# Patient Record
Sex: Female | Born: 1935 | Hispanic: No | Smoking: Never smoker
Health system: Southern US, Community
[De-identification: ages and names within clinical notes are randomized; demographics above are authoritative.]

## PROBLEM LIST (undated history)

## (undated) DIAGNOSIS — H409 Unspecified glaucoma: Secondary | ICD-10-CM

## (undated) DIAGNOSIS — R569 Unspecified convulsions: Secondary | ICD-10-CM

---

## 2018-05-08 DIAGNOSIS — Z23 Encounter for immunization: Secondary | ICD-10-CM | POA: Diagnosis not present

## 2018-08-11 DIAGNOSIS — I872 Venous insufficiency (chronic) (peripheral): Secondary | ICD-10-CM | POA: Diagnosis not present

## 2018-08-11 DIAGNOSIS — G40909 Epilepsy, unspecified, not intractable, without status epilepticus: Secondary | ICD-10-CM | POA: Diagnosis not present

## 2018-08-11 DIAGNOSIS — R000 Tachycardia, unspecified: Secondary | ICD-10-CM | POA: Diagnosis not present

## 2018-08-11 DIAGNOSIS — Z1331 Encounter for screening for depression: Secondary | ICD-10-CM | POA: Diagnosis not present

## 2018-08-11 DIAGNOSIS — H409 Unspecified glaucoma: Secondary | ICD-10-CM | POA: Diagnosis not present

## 2018-08-14 DIAGNOSIS — D649 Anemia, unspecified: Secondary | ICD-10-CM | POA: Diagnosis not present

## 2019-03-31 DIAGNOSIS — Z20828 Contact with and (suspected) exposure to other viral communicable diseases: Secondary | ICD-10-CM | POA: Diagnosis not present

## 2019-04-01 DIAGNOSIS — Z1159 Encounter for screening for other viral diseases: Secondary | ICD-10-CM | POA: Diagnosis not present

## 2019-04-17 DIAGNOSIS — Z20828 Contact with and (suspected) exposure to other viral communicable diseases: Secondary | ICD-10-CM | POA: Diagnosis not present

## 2019-05-10 DIAGNOSIS — Z23 Encounter for immunization: Secondary | ICD-10-CM | POA: Diagnosis not present

## 2019-08-10 DIAGNOSIS — Z23 Encounter for immunization: Secondary | ICD-10-CM | POA: Diagnosis not present

## 2019-09-07 DIAGNOSIS — Z23 Encounter for immunization: Secondary | ICD-10-CM | POA: Diagnosis not present

## 2019-11-22 DIAGNOSIS — R609 Edema, unspecified: Secondary | ICD-10-CM | POA: Diagnosis not present

## 2019-11-22 DIAGNOSIS — R600 Localized edema: Secondary | ICD-10-CM | POA: Diagnosis not present

## 2020-03-14 DIAGNOSIS — H35373 Puckering of macula, bilateral: Secondary | ICD-10-CM | POA: Diagnosis not present

## 2020-03-14 DIAGNOSIS — H353131 Nonexudative age-related macular degeneration, bilateral, early dry stage: Secondary | ICD-10-CM | POA: Diagnosis not present

## 2020-03-14 DIAGNOSIS — H35033 Hypertensive retinopathy, bilateral: Secondary | ICD-10-CM | POA: Diagnosis not present

## 2020-03-14 DIAGNOSIS — H401134 Primary open-angle glaucoma, bilateral, indeterminate stage: Secondary | ICD-10-CM | POA: Diagnosis not present

## 2020-05-29 DIAGNOSIS — Z23 Encounter for immunization: Secondary | ICD-10-CM | POA: Diagnosis not present

## 2020-06-19 DIAGNOSIS — Z23 Encounter for immunization: Secondary | ICD-10-CM | POA: Diagnosis not present

## 2020-11-05 DIAGNOSIS — Z79899 Other long term (current) drug therapy: Secondary | ICD-10-CM | POA: Diagnosis not present

## 2020-11-05 DIAGNOSIS — D649 Anemia, unspecified: Secondary | ICD-10-CM | POA: Diagnosis not present

## 2020-11-05 DIAGNOSIS — E039 Hypothyroidism, unspecified: Secondary | ICD-10-CM | POA: Diagnosis not present

## 2021-03-18 ENCOUNTER — Emergency Department (HOSPITAL_COMMUNITY)
Admission: EM | Admit: 2021-03-18 | Discharge: 2021-03-18 | Disposition: A | Discharge status: Home / Self Care | Payer: BC Managed Care – PPO | Attending: Emergency Medicine | Admitting: Emergency Medicine

## 2021-03-18 ENCOUNTER — Other Ambulatory Visit: Payer: Self-pay

## 2021-03-18 ENCOUNTER — Encounter (HOSPITAL_COMMUNITY): Payer: Self-pay

## 2021-03-18 ENCOUNTER — Emergency Department (HOSPITAL_COMMUNITY): Payer: BC Managed Care – PPO

## 2021-03-18 DIAGNOSIS — R0902 Hypoxemia: Secondary | ICD-10-CM | POA: Diagnosis not present

## 2021-03-18 DIAGNOSIS — R9431 Abnormal electrocardiogram [ECG] [EKG]: Secondary | ICD-10-CM | POA: Diagnosis not present

## 2021-03-18 DIAGNOSIS — R404 Transient alteration of awareness: Secondary | ICD-10-CM | POA: Diagnosis not present

## 2021-03-18 DIAGNOSIS — R569 Unspecified convulsions: Secondary | ICD-10-CM | POA: Insufficient documentation

## 2021-03-18 DIAGNOSIS — Z7982 Long term (current) use of aspirin: Secondary | ICD-10-CM | POA: Diagnosis not present

## 2021-03-18 DIAGNOSIS — R410 Disorientation, unspecified: Secondary | ICD-10-CM | POA: Diagnosis not present

## 2021-03-18 DIAGNOSIS — G40909 Epilepsy, unspecified, not intractable, without status epilepticus: Secondary | ICD-10-CM | POA: Diagnosis not present

## 2021-03-18 DIAGNOSIS — R519 Headache, unspecified: Secondary | ICD-10-CM | POA: Diagnosis not present

## 2021-03-18 DIAGNOSIS — Z79899 Other long term (current) drug therapy: Secondary | ICD-10-CM | POA: Insufficient documentation

## 2021-03-18 DIAGNOSIS — R4182 Altered mental status, unspecified: Secondary | ICD-10-CM | POA: Diagnosis not present

## 2021-03-18 DIAGNOSIS — Z743 Need for continuous supervision: Secondary | ICD-10-CM | POA: Diagnosis not present

## 2021-03-18 HISTORY — DX: Unspecified convulsions: R56.9

## 2021-03-18 HISTORY — DX: Unspecified glaucoma: H40.9

## 2021-03-18 LAB — CBC WITH DIFFERENTIAL/PLATELET
Abs Immature Granulocytes: 0.05 10*3/uL (ref 0.00–0.07)
Basophils Absolute: 0 10*3/uL (ref 0.0–0.1)
Basophils Relative: 0 %
Eosinophils Absolute: 0.1 10*3/uL (ref 0.0–0.5)
Eosinophils Relative: 1 %
HCT: 37.6 % (ref 36.0–46.0)
Hemoglobin: 12.5 g/dL (ref 12.0–15.0)
Immature Granulocytes: 1 %
Lymphocytes Relative: 10 %
Lymphs Abs: 1 10*3/uL (ref 0.7–4.0)
MCH: 30.1 pg (ref 26.0–34.0)
MCHC: 33.2 g/dL (ref 30.0–36.0)
MCV: 90.6 fL (ref 80.0–100.0)
Monocytes Absolute: 0.7 10*3/uL (ref 0.1–1.0)
Monocytes Relative: 7 %
Neutro Abs: 8 10*3/uL — ABNORMAL HIGH (ref 1.7–7.7)
Neutrophils Relative %: 81 %
Platelets: 248 10*3/uL (ref 150–400)
RBC: 4.15 MIL/uL (ref 3.87–5.11)
RDW: 13.8 % (ref 11.5–15.5)
WBC: 9.9 10*3/uL (ref 4.0–10.5)
nRBC: 0 % (ref 0.0–0.2)

## 2021-03-18 LAB — URINALYSIS, ROUTINE W REFLEX MICROSCOPIC
Bacteria, UA: NONE SEEN
Bilirubin Urine: NEGATIVE
Glucose, UA: NEGATIVE mg/dL
Hgb urine dipstick: NEGATIVE
Ketones, ur: NEGATIVE mg/dL
Nitrite: NEGATIVE
Protein, ur: NEGATIVE mg/dL
Specific Gravity, Urine: 1.004 — ABNORMAL LOW (ref 1.005–1.030)
pH: 7 (ref 5.0–8.0)

## 2021-03-18 LAB — COMPREHENSIVE METABOLIC PANEL
ALT: 14 U/L (ref 0–44)
AST: 30 U/L (ref 15–41)
Albumin: 4.2 g/dL (ref 3.5–5.0)
Alkaline Phosphatase: 79 U/L (ref 38–126)
Anion gap: 12 (ref 5–15)
BUN: 16 mg/dL (ref 8–23)
CO2: 23 mmol/L (ref 22–32)
Calcium: 9.3 mg/dL (ref 8.9–10.3)
Chloride: 99 mmol/L (ref 98–111)
Creatinine, Ser: 0.98 mg/dL (ref 0.44–1.00)
GFR, Estimated: 57 mL/min — ABNORMAL LOW (ref >60)
Glucose, Bld: 104 mg/dL — ABNORMAL HIGH (ref 70–99)
Potassium: 4 mmol/L (ref 3.5–5.1)
Sodium: 134 mmol/L — ABNORMAL LOW (ref 135–145)
Total Bilirubin: 1 mg/dL (ref 0.3–1.2)
Total Protein: 7.6 g/dL (ref 6.5–8.1)

## 2021-03-18 LAB — TSH: TSH: 1.726 u[IU]/mL (ref 0.350–4.500)

## 2021-03-18 MED ORDER — SODIUM CHLORIDE 0.9 % IV BOLUS
500.0000 mL | Freq: Once | INTRAVENOUS | Status: AC
Start: 2021-03-18 — End: 2021-03-18
  Administered 2021-03-18: 500 mL via INTRAVENOUS

## 2021-03-18 MED ORDER — LEVETIRACETAM 1000 MG PO TABS: 1000.0000 mg | ORAL_TABLET | Freq: Two times a day (BID) | ORAL | 0 refills | Status: DC

## 2021-03-18 MED ORDER — SODIUM CHLORIDE 0.9 % IV BOLUS
500.0000 mL | Freq: Once | INTRAVENOUS | Status: AC
  Administered 2021-03-18: 500 mL via INTRAVENOUS

## 2021-03-18 MED ORDER — KETOROLAC TROMETHAMINE 15 MG/ML IJ SOLN
15.0000 mg | Freq: Once | INTRAMUSCULAR | Status: AC
  Administered 2021-03-18: 15 mg via INTRAVENOUS
  Filled 2021-03-18: qty 1

## 2021-03-18 MED ORDER — PROCHLORPERAZINE EDISYLATE 10 MG/2ML IJ SOLN
5.0000 mg | Freq: Once | INTRAMUSCULAR | Status: AC
  Administered 2021-03-18: 5 mg via INTRAVENOUS
  Filled 2021-03-18: qty 2

## 2021-03-18 MED ORDER — DIPHENHYDRAMINE HCL 50 MG/ML IJ SOLN
12.5000 mg | Freq: Once | INTRAMUSCULAR | Status: AC
  Administered 2021-03-18: 12.5 mg via INTRAVENOUS
  Filled 2021-03-18: qty 1

## 2021-03-18 MED ORDER — LEVETIRACETAM 500 MG PO TABS
1000.0000 mg | ORAL_TABLET | Freq: Once | ORAL | Status: AC
  Administered 2021-03-18: 1000 mg via ORAL
  Filled 2021-03-18: qty 2

## 2021-03-18 MED ORDER — LORAZEPAM 2 MG/ML IJ SOLN
0.5000 mg | Freq: Once | INTRAMUSCULAR | Status: AC
  Administered 2021-03-18: 0.5 mg via INTRAVENOUS
  Filled 2021-03-18: qty 1

## 2021-03-18 MED ORDER — ACETAMINOPHEN 325 MG PO TABS
650.0000 mg | ORAL_TABLET | Freq: Once | ORAL | Status: AC
  Administered 2021-03-18: 650 mg via ORAL
  Filled 2021-03-18: qty 2

## 2021-03-18 NOTE — Discharge Instructions (Addendum)
You were seen in the emergency department today after a seizure.  Your CT scan of your head did not show any acute abnormalities.  Your blood work was overall reassuring.  You were given fluids and a migraine cocktail for your symptoms and heart rate which improved.  We are increasing your seizure medication dose given your seizure that you had today.  Please stop taking the 750 mg tablets twice per day and start taking the 1000 mg tablets twice per day.  Please call your neurologist first thing in the morning to make them aware of your visit and change in seizure medication.  Please do not drive or operate heavy machinery until you are cleared by your neurologist.  Please also call your primary care provider.  Please follow-up with your primary care provider and your neurologist as soon as possible.  Return to the emergency department for new or worsening symptoms including but not limited to new or worsening pain, numbness, weakness, change in your vision, new or worsening headache, sudden onset headache, fever, chest pain, trouble breathing, passing out, or any other concerns.

## 2021-03-18 NOTE — ED Provider Notes (Signed)
This patient is an 87 year old female history of seizure disorder found unresponsive sitting on the couch, she was unwitnessed seizure, 10 to 15 minutes of unresponsiveness then with a couple of minutes of tonic-clonic activity of the arms, then unresponsive again then back to her normal self.  She has a residual headache at this time but is talking answering questions and following commands per baseline.  The daughter at the bedside states she has her normal self.  She is able to tell me that she takes Keppra 750 mg twice daily and has for at least 6 months.  She is very compliant with her medications, she does not drink alcohol there is been no trauma, no changes in vision, no coughing shortness of breath chest pain abdominal pain back pain neck pain rashes swelling dysuria diarrhea or rectal bleeding.  CT scan of the head and labs pending, anticipate discharge if no further seizures, daughter is comfortable with this plan  Medical screening examination/treatment/procedure(s) were conducted as a shared visit with non-physician practitioner(s) and myself.  I personally evaluated the patient during the encounter.  Clinical Impression:   Final diagnoses:  None         Eber Hong, MD 03/28/21 [phone]

## 2021-03-18 NOTE — ED Notes (Signed)
Pt given glass of water and encouraged to drink.

## 2021-03-18 NOTE — ED Triage Notes (Signed)
BIB EMS for seizures, history of same. Pt was found unresponsive sitting on the couch, after 10-15 minutes pt had a witnessed seizure that lasted "5-6 minutes". Daughter at bedside. No fall, did not hit head. LKW 1030 today.After seizure pt was disoriented and had a bad headache.

## 2021-03-18 NOTE — ED Provider Notes (Signed)
[facility] EMERGENCY DEPARTMENT Provider Note   CSN: [id number] Arrival date & time: 03/18/21  1354     History Chief Complaint  Patient presents with   Seizures    Amber Baxter is a 87 y.o. female with history of known seizure disorder who presents to the emergency department after a seizure that occurred several hours ago.  Per the daughter, the patient was found down in the chair by the granddaughter that early this morning.  It is unknown how long she was unconscious for.  Shortly after coming to the patient had a seizure.  Per their daughter this was a typical seizure for her however, it lasted longer than normal.  She had a prolonged postictal phase per the daughter which prompted their arrival today.  She has been otherwise healthy and denies any fever, chills, sore throat, cough, abdominal pain, nausea, vomiting, diarrhea, and urinary complaints.  The history is provided by the patient (Daughter). No language interpreter was used.  Seizures     Past Medical History:  Diagnosis Date   Glaucoma    Seizures (HCC)     There are no problems to display for this patient.   History reviewed. No pertinent surgical history.   OB History   No obstetric history on file.     History reviewed. No pertinent family history.  Social History   Tobacco Use   Smoking status: Never   Smokeless tobacco: Never  Substance Use Topics   Drug use: Never    Home Medications Prior to Admission medications   Medication Sig Start Date End Date Taking? Authorizing Provider  aspirin EC 81 MG tablet Take 81 mg by mouth daily. Swallow whole.   Yes [provider]  levETIRAcetam (KEPPRA) 750 MG tablet Take 750 mg by mouth 2 (two) times daily.    [provider]    Allergies    Patient has no known allergies.  Review of Systems   Review of Systems  Neurological:  Positive for seizures.  All other systems reviewed and are  negative.  Physical Exam Updated Vital Signs BP (!) 180/81   Pulse 73   Temp 98.7 F (37.1 C) (Oral)   Resp 15   SpO2 94%   Physical Exam Constitutional:      General: She is not in acute distress.    Appearance: Normal appearance.  HENT:     Head: Normocephalic and atraumatic.  Eyes:     General:        Right eye: No discharge.        Left eye: No discharge.  Cardiovascular:     Comments: Regular rate and rhythm.  S1/S2 are distinct without any evidence of murmur, rubs, or gallops.  Radial pulses are 2+ bilaterally.  Dorsalis pedis pulses are 2+ bilaterally.  No evidence of pedal edema. Pulmonary:     Comments: Clear to auscultation bilaterally.  Normal effort.  No respiratory distress.  No evidence of wheezes, rales, or rhonchi heard throughout. Abdominal:     General: Abdomen is flat. Bowel sounds are normal. There is no distension.     Tenderness: There is no abdominal tenderness. There is no guarding or rebound.  Musculoskeletal:        General: Normal range of motion.     Cervical back: Neck supple.  Skin:    General: Skin is warm and dry.     Findings: No rash.  Neurological:     General: No focal deficit present.  Mental Status: She is alert and oriented to person, place, and time.     Comments: Cranial nerves II through XII are intact.  Patient has full range of motion in the upper and lower extremities.  5/5 strength bilaterally to the upper and lower extremities.  She answers all questions appropriately.  She has normal sensation bilaterally to the upper and lower extremities.  Psychiatric:        Mood and Affect: Mood normal.        Behavior: Behavior normal.    ED Results / Procedures / Treatments   Labs (all labs ordered are listed, but only abnormal results are displayed) Labs Reviewed  CBC WITH DIFFERENTIAL/PLATELET  COMPREHENSIVE METABOLIC PANEL  URINALYSIS, ROUTINE W REFLEX MICROSCOPIC    EKG EKG Interpretation  Date/Time:  Wednesday March 18 2021 14:10:25 EDT Ventricular Rate:  72 PR Interval:  169 QRS Duration: 98 QT Interval:  407 QTC Calculation: 446 R Axis:   46 Text Interpretation: Sinus rhythm Normal ECG No old tracing to compare Confirmed by Eber Hong ([id number]) on 03/18/2021 2:33:41 PM  Radiology No results found.  Procedures Procedures   Medications Ordered in ED Medications - No data to display  ED Course  I have reviewed the triage vital signs and the nursing notes.  Pertinent labs & imaging results that were available during my care of the patient were reviewed by me and considered in my medical decision making (see chart for details).    MDM Rules/Calculators/A&P                         Amber Baxter is a 87 year old female with history of known seizure disorder who presents the emergency department today after a subsequent seizure that began earlier today.  Patient normally takes Keppra 750 twice daily for her seizures.  Last seizure was a couple of months ago. Took her normal dose this morning.  Given that she had a prolonged postictal phase and was found unresponsive by the granddaughter I felt imaging was necessary.  CBC and CMP are pending.  CT head is pending.  UA is pending.  At this point, I suspect this is just form her known seizures. Rest of the work-up still pending. Care transferred to [facility], PA-C.   Final Clinical Impression(s) / ED Diagnoses Final diagnoses:  None    Rx / DC Orders ED Discharge Orders     None        Jolyn Lent 03/18/21 1543    Eber Hong, MD 03/28/21 [phone]

## 2021-03-18 NOTE — ED Provider Notes (Signed)
15:30: Assumed care of patient from [employer] PA-C at change of shift pending labs, CT, and disposition.   Please see prior provider note for full H&P.  Briefly patient is an 87 yo female with a hx of seizures who presented to the ED S/p seizure today. Lasted longer than usual and she had a headache following this which is atypical for her prompting ED visit. She is back to baseline currently. She has been compliant with her keppra.   Plan @ change of shift is to follow up on labs & CT, if no significant abnormalities and patient remains @ baseline discharge home with keppra dose increased to 1000 mg BID.   CBC/CMP/UA: overall reassuring. No significant electrolyte derangement or anemia. No leukocytosis.   CT head: 1. Generalized cerebral atrophy. 2. No acute intracranial abnormality.  Patient remains without focal neuro deficits however she is now tachycardic into the 120s, sinus tachycardia, will give fluids, PO challenge & re-assess.   Tolerating PO, continues to be tachycardic, recheck temp 98.1- afebrile, no chest pain/dyspnea/palpitations. ---> 21:00: Discussed w/ attending, recommends additional fluids & 0.5 mg of ativan. I have also ordered TSH.   Discussed w/ neurology given patient's persistent tachycardia S/p seizure earlier today, no specific post seizure cause given this far out.   TSH: WNL.   22:50: Patient with continued tachycardia @ 118 bpm, she states her head still hurts some, will give migraine cocktail and re-evaluation- discussed w/ Dr. Posey Rea- recommends 15 mg of toradol with compazine & benadryl, will also give tylenol.   23:15: HR 88 bpm.   23:30: HR 82 bpm, patient feels much better, she would like to go home and her son feels comfortable with plan for discharge.   I discussed results, treatment plan, need for follow-up, and return precautions with the patient and her son. Provided opportunity for questions, patient & her son confirmed understanding and are in  agreement with plan.    This is a shared visit with supervising physician Dr. Posey Rea who has independently evaluated patient & provided guidance in evaluation/management/disposition, in agreement with care    Results for orders placed or performed during the hospital encounter of 03/18/21  CBC with Differential  Result Value Ref Range   WBC 9.9 4.0 - 10.5 K/uL   RBC 4.15 3.87 - 5.11 MIL/uL   Hemoglobin 12.5 12.0 - 15.0 g/dL   HCT 38.1 01.7 - 51.0 %   MCV 90.6 80.0 - 100.0 fL   MCH 30.1 26.0 - 34.0 pg   MCHC 33.2 30.0 - 36.0 g/dL   RDW 25.8 52.7 - 78.2 %   Platelets 248 150 - 400 K/uL   nRBC 0.0 0.0 - 0.2 %   Neutrophils Relative % 81 %   Neutro Abs 8.0 (H) 1.7 - 7.7 K/uL   Lymphocytes Relative 10 %   Lymphs Abs 1.0 0.7 - 4.0 K/uL   Monocytes Relative 7 %   Monocytes Absolute 0.7 0.1 - 1.0 K/uL   Eosinophils Relative 1 %   Eosinophils Absolute 0.1 0.0 - 0.5 K/uL   Basophils Relative 0 %   Basophils Absolute 0.0 0.0 - 0.1 K/uL   Immature Granulocytes 1 %   Abs Immature Granulocytes 0.05 0.00 - 0.07 K/uL  Comprehensive metabolic panel  Result Value Ref Range   Sodium 134 (L) 135 - 145 mmol/L   Potassium 4.0 3.5 - 5.1 mmol/L   Chloride 99 98 - 111 mmol/L   CO2 23 22 - 32 mmol/L   Glucose,  Bld 104 (H) 70 - 99 mg/dL   BUN 16 8 - 23 mg/dL   Creatinine, Ser 4.40 0.44 - 1.00 mg/dL   Calcium 9.3 8.9 - 10.2 mg/dL   Total Protein 7.6 6.5 - 8.1 g/dL   Albumin 4.2 3.5 - 5.0 g/dL   AST 30 15 - 41 U/L   ALT 14 0 - 44 U/L   Alkaline Phosphatase 79 38 - 126 U/L   Total Bilirubin 1.0 0.3 - 1.2 mg/dL   GFR, Estimated 57 (L) >60 mL/min   Anion gap 12 5 - 15  Urinalysis, Routine w reflex microscopic Urine, Clean Catch  Result Value Ref Range   Color, Urine STRAW (A) YELLOW   APPearance CLEAR CLEAR   Specific Gravity, Urine 1.004 (L) 1.005 - 1.030   pH 7.0 5.0 - 8.0   Glucose, UA NEGATIVE NEGATIVE mg/dL   Hgb urine dipstick NEGATIVE NEGATIVE   Bilirubin Urine NEGATIVE NEGATIVE    Ketones, ur NEGATIVE NEGATIVE mg/dL   Protein, ur NEGATIVE NEGATIVE mg/dL   Nitrite NEGATIVE NEGATIVE   Leukocytes,Ua TRACE (A) NEGATIVE   RBC / HPF 0-5 0 - 5 RBC/hpf   WBC, UA 11-20 0 - 5 WBC/hpf   Bacteria, UA NONE SEEN NONE SEEN   Squamous Epithelial / LPF 0-5 0 - 5   WBC Clumps PRESENT   TSH  Result Value Ref Range   TSH 1.726 0.350 - 4.500 uIU/mL   CT HEAD WO CONTRAST ( )  Result Date: 03/18/2021 CLINICAL DATA:  Seizure. EXAM: CT HEAD WITHOUT CONTRAST TECHNIQUE: Contiguous axial images were obtained from the base of the skull through the vertex without intravenous contrast. COMPARISON:  None. FINDINGS: Brain: There is mild cerebral atrophy with widening of the extra-axial spaces and ventricular dilatation. There are areas of decreased attenuation within the white matter tracts of the supratentorial brain, consistent with microvascular disease changes. Bilateral basal ganglia calcification is noted. Vascular: No hyperdense vessel or unexpected calcification. Skull: Normal. Negative for fracture or focal lesion. Sinuses/Orbits: Mild posterior left ethmoid sinus mucosal thickening is seen. Other: None. IMPRESSION: 1. Generalized cerebral atrophy. 2. No acute intracranial abnormality. Electronically Signed   By: Aram Candela M.D.   On: 03/18/2021 17:45      Brodrick Curran, Pleas Koch, PA-C 03/19/21 0011    Glendora Score, MD 03/19/21 Moses Manners

## 2021-03-31 ENCOUNTER — Encounter: Payer: Self-pay | Admitting: Neurology

## 2021-03-31 ENCOUNTER — Ambulatory Visit (INDEPENDENT_AMBULATORY_CARE_PROVIDER_SITE_OTHER): Payer: BC Managed Care – PPO | Admitting: Neurology

## 2021-03-31 VITALS — BP 179/91 | HR 91 | Ht 62.0 in | Wt 145.5 lb

## 2021-03-31 DIAGNOSIS — G40309 Generalized idiopathic epilepsy and epileptic syndromes, not intractable, without status epilepticus: Secondary | ICD-10-CM | POA: Diagnosis not present

## 2021-03-31 DIAGNOSIS — Z5181 Encounter for therapeutic drug level monitoring: Secondary | ICD-10-CM

## 2021-03-31 DIAGNOSIS — R5381 Other malaise: Secondary | ICD-10-CM

## 2021-03-31 DIAGNOSIS — R030 Elevated blood-pressure reading, without diagnosis of hypertension: Secondary | ICD-10-CM | POA: Diagnosis not present

## 2021-03-31 DIAGNOSIS — E569 Vitamin deficiency, unspecified: Secondary | ICD-10-CM | POA: Diagnosis not present

## 2021-03-31 MED ORDER — LEVETIRACETAM 1000 MG PO TABS: 1000.0000 mg | ORAL_TABLET | Freq: Two times a day (BID) | ORAL | 12 refills | Status: DC

## 2021-03-31 MED ORDER — GABAPENTIN 100 MG PO CAPS: 100.0000 mg | ORAL_CAPSULE | Freq: Three times a day (TID) | ORAL | 3 refills | Status: AC

## 2021-03-31 NOTE — Progress Notes (Signed)
[facility]  PATIENT: Amber Baxter DOB: 09-19-1933  REFERRING CLINICIAN: Chilton Greathouse, MD HISTORY FROM: Patient and daughter in law Dewayne Hatch. REASON FOR VISIT: Seizure   HISTORICAL  CHIEF COMPLAINT:  Chief Complaint  Patient presents with   seizure    New patient:internal referral:  Had a prolonged seizure with a much longer post-ictal phase Room 13, daughter in [place] in room    HISTORY OF PRESENT ILLNESS:  This is a 87 year old woman with past medical history of glaucoma and seizures who is presenting after a seizure episode.  Patient states that her first seizure was 35 years ago.  At that time they thought she had a syncopal episode.  She was seizure-free for about 13 years later when she had another seizure and since then has been started on Levetiracetam.  Patient stated she has been doing well on levetiracetam but also noted that she has been having on average 1 seizure every 3 months.  Her last seizure was on August 31.  On the day patient remember waking up in the morning, decided to put some laundry and was sitting on the chair and she does not remember what happened next.  Per daughter in law who is present today, she found patient on the chair, she did not know how long she has been on the chair and then she noted seizure like activities. She described the seizures are shaking in the upper extremity.  After the seizure, she had a long postictal period where she was confused, disoriented and irritable and angry.  Daughter mentioned that the entire postictal episode lasted about 40 minutes.  She was taken to the hospital where infectious work-up has been negative, head CT was negative for any acute intracranial abnormality and her Keppra was increased from 750 twice daily to 1000 mg twice daily.  Since being discharged patient denies any additional seizures.  She reported being compliant with her medication.  Denies any recent illness or sickness,  but stated during that time she was very stressed about her 2 brothers living in [place].  She also noted that she has not been sleeping well.  Currently she get about 4 to 6 hours at best every night.  She does note that she wake up multiple times during the night to use the bathroom and most of the time she is not able to go back to sleep.   Handedness: Right handed   Seizure Type: Shaking of the upper body (?generalized seizure)   Current frequency: 1 every 3 months   Any injuries from seizures: None   Seizure risk factors: No seizure risk factors   Previous ASMs: Levetiracetam   Currenty ASMs: Levetiracetam 1000 mg BID   ASMs side effects: None   Brain Images: 1. Generalized cerebral atrophy. 2. No acute intracranial abnormality  Previous EEGs: None    OTHER MEDICAL CONDITIONS: Glaucoma  REVIEW OF SYSTEMS: Full 14 system review of systems performed and negative with exception of: None   ALLERGIES: No Known Allergies  HOME MEDICATIONS: Outpatient Medications Prior to Visit  Medication Sig Dispense Refill   aspirin EC 81 MG tablet Take 81 mg by mouth daily. Swallow whole.     brimonidine (ALPHAGAN P) 0.1 % SOLN      levETIRAcetam (KEPPRA) 1000 MG tablet Take 1 tablet (1,000 mg total) by mouth 2 (two) times daily. 60 tablet 0   No facility-administered medications prior to visit.    PAST MEDICAL HISTORY: Past Medical History:  Diagnosis  Date   Glaucoma    Seizures (HCC)     PAST SURGICAL HISTORY: History reviewed. No pertinent surgical history.  FAMILY HISTORY: History reviewed. No pertinent family history.  SOCIAL HISTORY: Social History   Socioeconomic History   Marital status: Widowed    Spouse name: Not on file   Number of children: Not on file   Years of education: Not on file   Highest education level: Not on file  Occupational History   Not on file  Tobacco Use   Smoking status: Never   Smokeless tobacco: Never  Substance and Sexual Activity    Alcohol use: Not on file    Comment: occassionally   Drug use: Never   Sexual activity: Never  Other Topics Concern   Not on file  Social History Narrative   Lives with son and daughter in law   Right Handed   Drinks 1 cup caffeine daily   Social Determinants of Health   Financial Resource Strain: Not on file  Food Insecurity: Not on file  Transportation Needs: Not on file  Physical Activity: Not on file  Stress: Not on file  Social Connections: Not on file  Intimate Partner Violence: Not on file     PHYSICAL EXAM  GENERAL EXAM/CONSTITUTIONAL: Vitals:  Vitals:   03/31/21 0812  BP: (!) 179/91  Pulse: 91  Weight: 145 lb 8 oz (66 kg)  Height: 5\' 2"  (1.575 m)   Body mass index is 26.61 kg/m. Wt Readings from Last 3 Encounters:  03/31/21 145 lb 8 oz (66 kg)   Patient is in no distress; well developed, nourished and groomed; neck is supple  CARDIOVASCULAR: Examination of carotid arteries is normal; no carotid bruits Regular rate and rhythm, no murmurs Examination of peripheral vascular system by observation and palpation is normal  EYES: Pupils round and reactive to light, Visual fields full to confrontation, Extraocular movements intacts,   MUSCULOSKELETAL: Gait, strength, tone, movements noted in Neurologic exam below  NEUROLOGIC: MENTAL STATUS:  awake, alert, oriented to person, place and time recent and remote memory intact normal attention and concentration language fluent, comprehension intact, naming intact fund of knowledge appropriate  CRANIAL NERVE:  2nd, 3rd, 4th, 6th - pupils equal and reactive to light, visual fields full to confrontation, extraocular muscles intact, no nystagmus 5th - facial sensation symmetric 7th - facial strength symmetric 8th - hearing intact 9th - palate elevates symmetrically, uvula midline 11th - shoulder shrug symmetric 12th - tongue protrusion midline  MOTOR:  normal bulk and tone, full strength in the BUE,  BLE  SENSORY:  normal and symmetric to light touch, pinprick, temperature, vibration  COORDINATION:  finger-nose-finger, fine finger movements normal  REFLEXES:  deep tendon reflexes present and symmetric  GAIT/STATION:  Hesitant and slow    DIAGNOSTIC DATA (LABS, IMAGING, TESTING) - I reviewed patient records, labs, notes, testing and imaging myself where available.  Lab Results  Component Value Date   WBC 9.9 03/18/2021   HGB 12.5 03/18/2021   HCT 37.6 03/18/2021   MCV 90.6 03/18/2021   PLT 248 03/18/2021      Component Value Date/Time   NA 134 (L) 03/18/2021 1450   K 4.0 03/18/2021 1450   CL 99 03/18/2021 1450   CO2 23 03/18/2021 1450   GLUCOSE 104 (H) 03/18/2021 1450   BUN 16 03/18/2021 1450   CREATININE 0.98 03/18/2021 1450   CALCIUM 9.3 03/18/2021 1450   PROT 7.6 03/18/2021 1450   ALBUMIN 4.2 03/18/2021 1450  AST 30 03/18/2021 1450   ALT 14 03/18/2021 1450   ALKPHOS 79 03/18/2021 1450   BILITOT 1.0 03/18/2021 1450   GFRNONAA 57 (L) 03/18/2021 1450   No results found for: CHOL, HDL, LDLCALC, LDLDIRECT, TRIG No results found for: HGBA1C No results found for: VITAMINB12 Lab Results  Component Value Date   TSH 1.726 03/18/2021    Head CT 03/18/2021:  1. Generalized cerebral atrophy. 2. No acute intracranial abnormality.   I personally reviewed brain Images.    ASSESSMENT AND PLAN  87 y.o. year old female  with past medical history of glaucoma and seizure who is coming for evaluation after breakthrough seizure on August 31.  She reported being compliant with her medication, no recent infection but noted that she was under a lot of stress and her sleep was poor.  Patient likely have a breakthrough seizure in the setting of high stress and poor sleep education about seizure exacerbation discussed with patient and family.  I will obtain a levetiracetam level today including a B12 and vitamin D.  I will also order the patient for a 48-hour EEG to capture  sleep time.  I have also started the patient on low-dose gabapentin to help with her sleep, advised the patient to increase to 200 or even 300 mg as tolerated.  I will see the patient in 45-month or sooner if worse.  Discussed about seizure precaution.  There was also evidence of deconditioning on exam, I recommended physical therapy but patient kindly declined.  I strongly recommend using walker especially since she is starting gabapentin for sleep aid and since patient has been waking up multiple times during the night to use the bathroom.  She voices understanding. Finally, her blood pressure reading was elevated today, I also encouraged her to measure her blood pressure at home for the next month and to provide those readings to her primary care doctor, again she voices understanding.   1. Generalized idiopathic epilepsy and epileptic syndromes, not intractable, without status epilepticus (HCC)   2. Therapeutic drug monitoring   3. Elevated blood pressure reading   4. Physical deconditioning     PLAN: Check Levetiracetam, Vitamin D and Vitamin B12 level today  Continue with Levetiracetam 1000 mg twice a day  Start Gabapentin 100 mg nightly for sleep and she has been waking up in the middle of the night to use the bathroom, can increase to 200 mg or 300 mg nightly as tolerated  Return in 6 months    Per [facility] statutes, patients with seizures are not allowed to drive until they have been seizure-free for six months.  Other recommendations include using caution when using heavy equipment or power tools. Avoid working on ladders or at heights. Take showers instead of baths.  Do not swim alone.  Ensure the water temperature is not too high on the home water heater. Do not go swimming alone. Do not lock yourself in a room alone (i.e. bathroom). When caring for infants or small children, sit down when holding, feeding, or changing them to minimize risk of injury to the child in the event  you have a seizure. Maintain good sleep hygiene. Avoid alcohol.  Also recommend adequate sleep, hydration, good diet and minimize stress.   During the Seizure  - First, ensure adequate ventilation and place patients on the floor on their left side  Loosen clothing around the neck and ensure the airway is patent. If the patient is clenching the teeth, do not  force the mouth open with any object as this can cause severe damage - Remove all items from the surrounding that can be hazardous. The patient may be oblivious to what's happening and may not even know what he or she is doing. If the patient is confused and wandering, either gently guide him/her away and block access to outside areas - Reassure the individual and be comforting - Call 911. In most cases, the seizure ends before EMS arrives. However, there are cases when seizures may last over 3 to 5 minutes. Or the individual may have developed breathing difficulties or severe injuries. If a pregnant patient or a person with diabetes develops a seizure, it is prudent to call an ambulance. - Finally, if the patient does not regain full consciousness, then call EMS. Most patients will remain confused for about 45 to 90 minutes after a seizure, so you must use judgment in calling for help. - Avoid restraints but make sure the patient is in a bed with padded side rails - Place the individual in a lateral position with the neck slightly flexed; this will help the saliva drain from the mouth and prevent the tongue from falling backward - Remove all nearby furniture and other hazards from the area - Provide verbal assurance as the individual is regaining consciousness - Provide the patient with privacy if possible - Call for help and start treatment as ordered by the caregiver   After the Seizure (Postictal Stage)  After a seizure, most patients experience confusion, fatigue, muscle pain and/or a headache. Thus, one should permit the individual to  sleep. For the next few days, reassurance is essential. Being calm and helping reorient the person is also of importance.  Most seizures are painless and end spontaneously. Seizures are not harmful to others but can lead to complications such as stress on the lungs, brain and the heart. Individuals with prior lung problems may develop labored breathing and respiratory distress.     Orders Placed This Encounter  Procedures   Levetiracetam level   Vitamin D, 25-hydroxy   Vitamin B12   EEG adult    Meds ordered this encounter  Medications   levETIRAcetam (KEPPRA) 1000 MG tablet    Sig: Take 1 tablet (1,000 mg total) by mouth 2 (two) times daily.    Dispense:  60 tablet    Refill:  12   gabapentin (NEURONTIN) 100 MG capsule    Sig: Take 1 capsule (100 mg total) by mouth 3 (three) times daily.    Dispense:  90 capsule    Refill:  3    Return in about 6 months (around 09/28/2021).    Windell Norfolk, MD 03/31/2021, 12:19 PM  [facility] [address], [address] [place], [place] [address] [phone]

## 2021-03-31 NOTE — Patient Instructions (Addendum)
Check Levetiracetam, Vitamin D and Vitamin B12 level today  Continue with Levetiracetam 1000 mg twice a day  Start Gabapentin 100 mg nightly for sleep aid, can increase to 200 mg or 300 mg nightly as tolerated  Return in 6 months    Per [facility] statutes, patients with seizures are not allowed to drive until they have been seizure-free for six months.  Other recommendations include using caution when using heavy equipment or power tools. Avoid working on ladders or at heights. Take showers instead of baths.  Do not swim alone.  Ensure the water temperature is not too high on the home water heater. Do not go swimming alone. Do not lock yourself in a room alone (i.e. bathroom). When caring for infants or small children, sit down when holding, feeding, or changing them to minimize risk of injury to the child in the event you have a seizure. Maintain good sleep hygiene. Avoid alcohol.  Also recommend adequate sleep, hydration, good diet and minimize stress.   During the Seizure  - First, ensure adequate ventilation and place patients on the floor on their left side  Loosen clothing around the neck and ensure the airway is patent. If the patient is clenching the teeth, do not force the mouth open with any object as this can cause severe damage - Remove all items from the surrounding that can be hazardous. The patient may be oblivious to what's happening and may not even know what he or she is doing. If the patient is confused and wandering, either gently guide him/her away and block access to outside areas - Reassure the individual and be comforting - Call 911. In most cases, the seizure ends before EMS arrives. However, there are cases when seizures may last over 3 to 5 minutes. Or the individual may have developed breathing difficulties or severe injuries. If a pregnant patient or a person with diabetes develops a seizure, it is prudent to call an ambulance. - Finally, if the patient does not  regain full consciousness, then call EMS. Most patients will remain confused for about 45 to 90 minutes after a seizure, so you must use judgment in calling for help. - Avoid restraints but make sure the patient is in a bed with padded side rails - Place the individual in a lateral position with the neck slightly flexed; this will help the saliva drain from the mouth and prevent the tongue from falling backward - Remove all nearby furniture and other hazards from the area - Provide verbal assurance as the individual is regaining consciousness - Provide the patient with privacy if possible - Call for help and start treatment as ordered by the caregiver   After the Seizure (Postictal Stage)  After a seizure, most patients experience confusion, fatigue, muscle pain and/or a headache. Thus, one should permit the individual to sleep. For the next few days, reassurance is essential. Being calm and helping reorient the person is also of importance.  Most seizures are painless and end spontaneously. Seizures are not harmful to others but can lead to complications such as stress on the lungs, brain and the heart. Individuals with prior lung problems may develop labored breathing and respiratory distress.

## 2021-04-02 ENCOUNTER — Telehealth: Payer: Self-pay | Admitting: Neurology

## 2021-04-02 NOTE — Telephone Encounter (Signed)
Faxed 48 hr EEG order to [facility], they will reach out to pt to schedule.

## 2021-04-03 ENCOUNTER — Telehealth: Payer: Self-pay | Admitting: Neurology

## 2021-04-03 LAB — LEVETIRACETAM LEVEL: Levetiracetam Lvl: 68.1 ug/mL — ABNORMAL HIGH (ref 10.0–40.0)

## 2021-04-03 LAB — VITAMIN B12: Vitamin B-12: 370 pg/mL (ref 232–1245)

## 2021-04-03 LAB — VITAMIN D 25 HYDROXY (VIT D DEFICIENCY, FRACTURES): Vit D, 25-Hydroxy: 15.4 ng/mL — ABNORMAL LOW (ref 30.0–100.0)

## 2021-04-03 MED ORDER — VITAMIN D (ERGOCALCIFEROL) 1.25 MG (50000 UNIT) PO CAPS: 50000.0000 [IU] | ORAL_CAPSULE | ORAL | 8 refills | Status: AC

## 2021-04-03 NOTE — Telephone Encounter (Signed)
Left a message with her son Angela Burke regarding her labs.  Informed him that her vitamin D level was low and I will send a prescription for vitamin D to take once weekly for 8 weeks and she can follow-up with her primary care doctor to have it rechecked. Her other labs were within normal limits.   Windell Norfolk, MD

## 2021-04-07 NOTE — Telephone Encounter (Signed)
Pt is scheduled for her In-Home Video EEG from 9/27 - 04/16/21, per her request.

## 2021-04-13 ENCOUNTER — Telehealth: Payer: Self-pay | Admitting: Neurology

## 2021-04-13 ENCOUNTER — Other Ambulatory Visit: Payer: Self-pay | Admitting: Emergency Medicine

## 2021-04-13 MED ORDER — LEVETIRACETAM 1000 MG PO TABS: 1000.0000 mg | ORAL_TABLET | Freq: Two times a day (BID) | ORAL | 12 refills | Status: AC

## 2021-04-13 NOTE — Telephone Encounter (Addendum)
Pt's daughter in law is asking that the levETIRAcetam (KEPPRA) 1000 MG tablet be called into [id number] Daughter in law asked it be noted pt is down to about 4 pills.

## 2021-04-13 NOTE — Telephone Encounter (Signed)
Prescription sent in to [id number], [address] in [place].

## 2021-04-22 DIAGNOSIS — G40309 Generalized idiopathic epilepsy and epileptic syndromes, not intractable, without status epilepticus: Secondary | ICD-10-CM | POA: Diagnosis not present

## 2021-04-23 DIAGNOSIS — G40309 Generalized idiopathic epilepsy and epileptic syndromes, not intractable, without status epilepticus: Secondary | ICD-10-CM

## 2021-04-27 ENCOUNTER — Other Ambulatory Visit: Payer: Self-pay | Admitting: Neurology

## 2021-04-27 DIAGNOSIS — G40309 Generalized idiopathic epilepsy and epileptic syndromes, not intractable, without status epilepticus: Secondary | ICD-10-CM

## 2021-04-27 NOTE — Procedures (Signed)
Description 44 Hours Ambulatory Video EEG  TECHNICAL DESCRIPTION: This AVEEG was performed using equipment provided by [employer] utilizing Bluetooth ( Trackit ) amplifiers with continuous EEGT attended video collection using encrypted remote transmission via [employer] secured cellular tower network with data rates for each AVEEG performed. This is a Therapist, music AVEEG, obtained, according to the 10-20 international electrode placement system, reformatted digitally into referential and bipolar montages. Data was acquired with a minimum of 21 bipolar connections and sampled at a minimum rate of 250 cycles per second per channel, maximum rate of 450 cycles per second per channel and two channels for EKG. The entire AVEEG study was recorded through cable and or radio telemetry for subsequent analysis. Specified epochs of the AVEEG data were identified at the direction of the subject by the depression of a push button by the patient. Each patient's event file included data acquired two minutes prior to the push button activation and continuing until two minutes afterwards. AVEEG files were reviewed on R[employer] Platform by [employer] with a digital high frequency filter set at 70 Hz and a low frequency filter set at 0.1 Hz with a paper speed of 2mm/s resulting in 10 seconds per digital page. This entire AVEEG was reviewed by the EEG Technologist. Random time samples, random sleep samples, clips, patient initiated push button files with included patient daily diary logs, EEG Technologist bookmarked note data was reviewed and verified for accuracy and validity by the governing reading neurologist in full details. This AEEGV was fully compliant with all requirements for CPT 97500 for setup, patient education, take down and administered by an EEG technologist.   INTERMITTENT MONITORING WITH VIDEO TECHNICAL DESCRIPTION:  This Long-Term AVEEG was monitored intermittently by a qualified EEG technologist for  the entirety of the recording; quality check-ins were performed at a minimum of every two hours, checking and documenting real-time data and video to assure the integrity and quality of the recording (e.g., camera position, electrode integrity and impedance), and identify the need for maintenance. For intermittent monitoring, an EEG Technologist monitored no more than 12 patients concurrently. Diagnostic video was captured at least 80% of the time during the recording.   TECHNOLOGIST EVENTS: Notes for potential focal slowing, left temporal sharply contoured morphology events were detected by the reviewing neurodiagnostic technologist during the recording for further evaluation.  PATIENT EVENTS:  The patient pushed the event button 0 times during the AVEEG recording for further evaluation. However, EEGT test press #1 time with pt at the beginning of the tracing.  TIME SAMPLES: 1 minute of every hour recorded are reviewed as random time samples.   SLEEP SAMPLES: 5 minutes of every 24 hour recorded sleep cycle are reviewed as random sleep samples.   BACKGROUND EEG: This AVEEG consists of well-modulated bilateral synchronous and symmetrical background in the alpha frequencies in the awake state.  AWAKE: The posterior dominant rhythm was characterized by symmetric and reactive 8 Hz activity with eyes closed.   SLEEP: Stage two sleep displayed Sleep Spindles and Vertex Waveform components. All other sleep stages appeared symmetrical and synchronous with regards to K-Complexes, and REM.  EKG: Normal sinus rhythm.   AVEEG Interpretations: This is an abnormal ambulatory video EEG due to:  Left temporal sharp  2.   Left temporal focal slowing  3.   Mild generalized slowing .   Left temporal sharp and left temporal focal slowing consistent with an area of epileptogenic potential. Generalized slowing consistent with a generalized brain dysfunction.  Windell Norfolk, MD [facility]

## 2021-04-27 NOTE — Procedures (Signed)
     Technical Data: This RVEEG was performed using equipment provided by [employer] utilizing Bluetooth ( Trackit ) amplifiers with EEGT attended video collected and performed with a 19-channel digital VEEG, Data was acquired with a minimum of 21 bipolar connections and sampled at a minimum rate of 250 cycles per second per channel and one channel for EKG, obtained according to 10-20 international electrode placement system, reformatted digitally into referential and bipolar montages, reviewed and verified for accuracy and validity by the governing reading neurologist in full details.   Activating Procedure:  Photic Stimulation was Performed. Photic Driving Response seen. Hyperventilation was not performed due to pt contraindication of Droplet Precaution-COVID-19.   No clear epileptiform activity was detected by the reviewing neurodiagnostic technologist for further evaluation.   Intermittent artifacts could not be resolved. All impedance under 10K ohms.   Pt is awake, alert, cooperative and orientated Xs 4 of 4? correct.   EEGT Attended Video Recording: Yes   Hardware - (Trackit) by [employer] - Rendr    RVEEG Description:  This RVEEG was obtained during awake and drowsy states, during wakefulness posterior dominant rhythm was up to 8 Hz, alpha, well-modulated, moderate in voltage, and attenuated appropriately with eye opening. During the recording there was no focal or lateralized epileptiform activity or focal slowing noted. Drowsiness was marked by stage I sleep. Hyperventilation was not performed due to pt contraindication of Droplet Precaution. Photic stimulation resulted in posterior driving responses at multiple frequencies, without any abnormal photo paroxysmal responses. EKG normal sinus rhythm.     RVEEG Interpretations: This is an abnormal EEG due to mild diffuse slowing. Diffuse slowing consistent with an area of generalized brain dysfunction.    Windell Norfolk,  MD [facility]

## 2021-04-28 ENCOUNTER — Telehealth: Payer: Self-pay | Admitting: Neurology

## 2021-04-28 NOTE — Telephone Encounter (Signed)
Amber Baxter called states someone called, she was returning the call. Requesting a call back.

## 2021-04-28 NOTE — Telephone Encounter (Signed)
I don't see any recent telephone calls documented.

## 2021-09-28 ENCOUNTER — Ambulatory Visit: Payer: BC Managed Care – PPO | Admitting: Neurology

## 2021-11-27 IMAGING — CT CT HEAD W/O CM
4 series · 17 of 47 positions shown, 19 images · non-contrast
Comparison: None.

CLINICAL DATA: Seizure.

EXAM:
CT HEAD WITHOUT CONTRAST
TECHNIQUE: Contiguous axial images were obtained from the base of the skull
through the vertex without intravenous contrast.

[Series 3: head wo · axial · 0.42mm/px · z∈[+1280,+1395]mm · 7 of 31 slices shown, 9 images]
[im 4/31  brain]
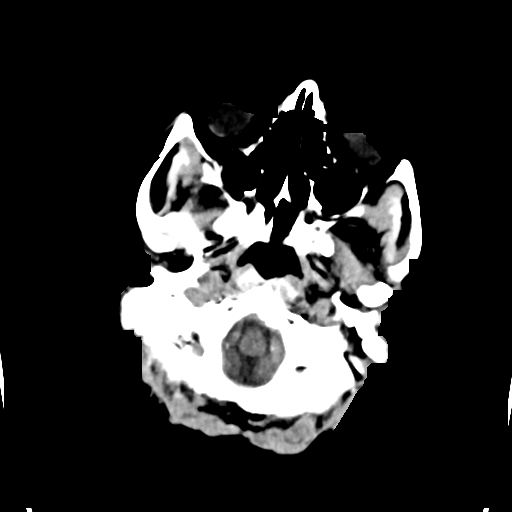
[im 4/31  bone]
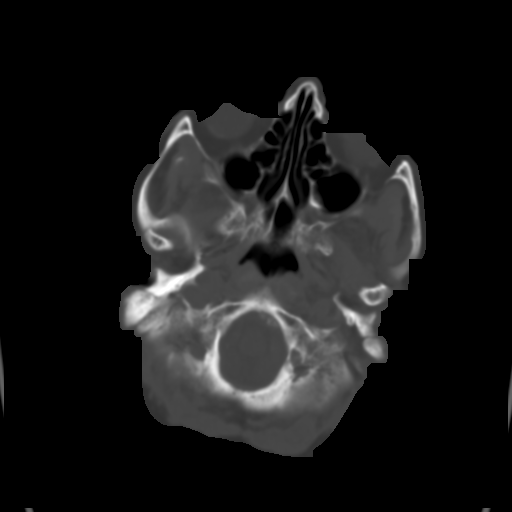
[im 8/31  brain]
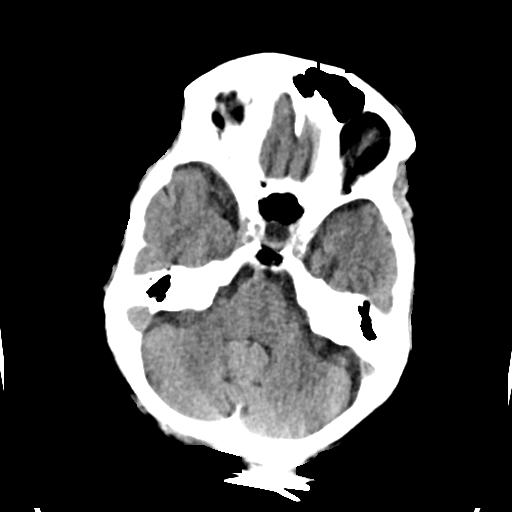
[im 12/31  brain]
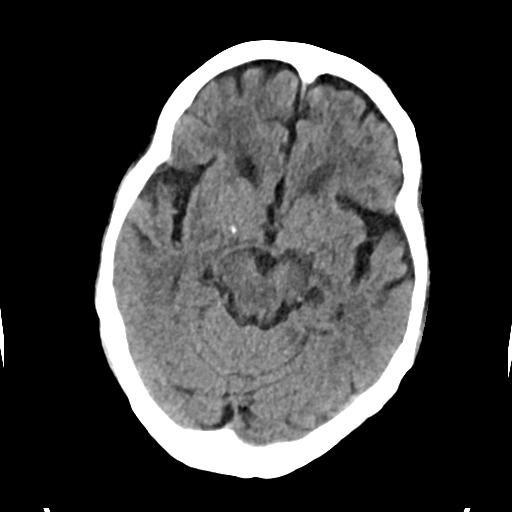
[im 16/31  brain]
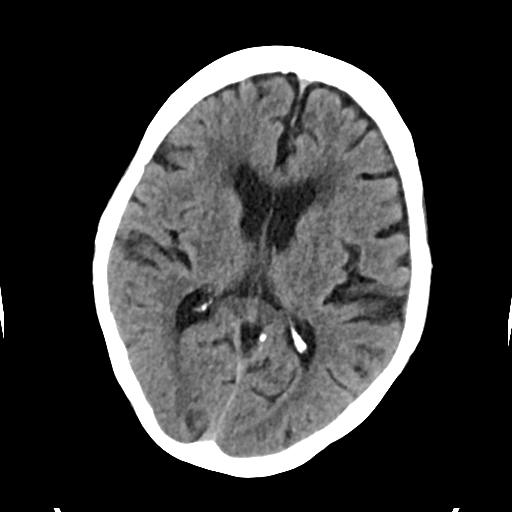
[im 19/31  brain]
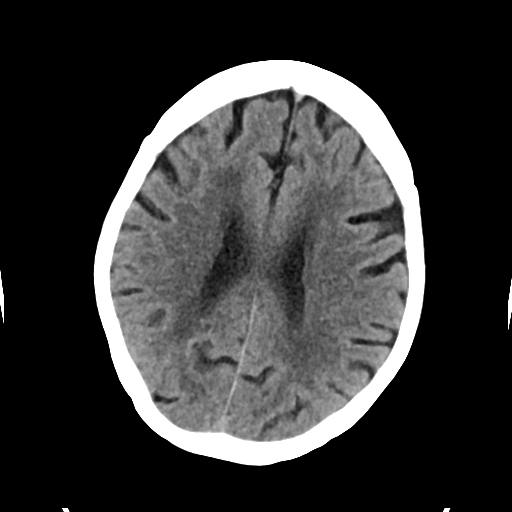
[im 19/31  bone]
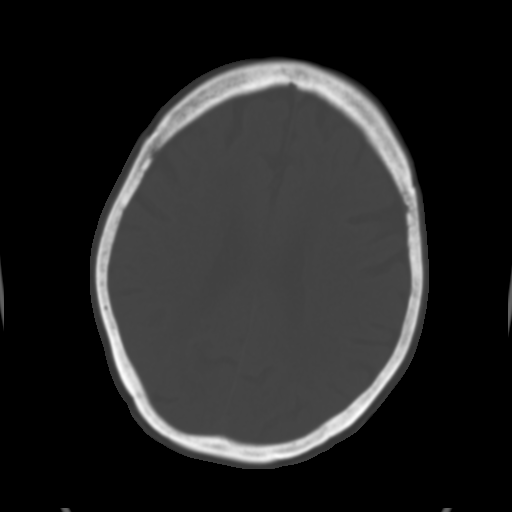
[im 23/31  brain]
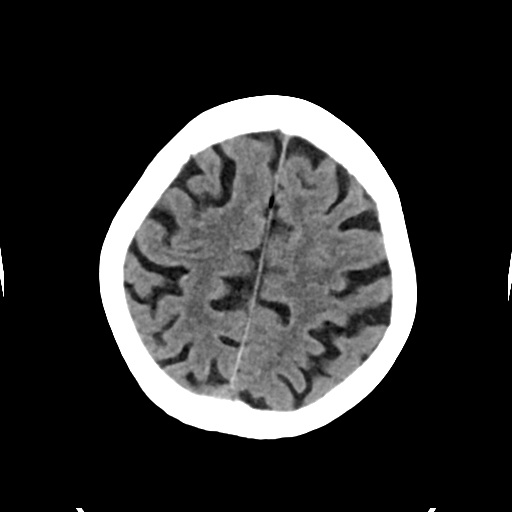
[im 27/31  brain]
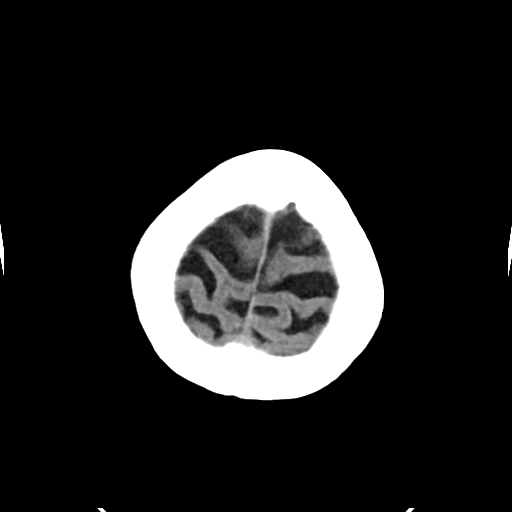

[Series 4: head bone · axial · 0.42mm/px · z∈[+1279,+1331]mm · 4 of 76 slices shown]
[im 8/76  bone]
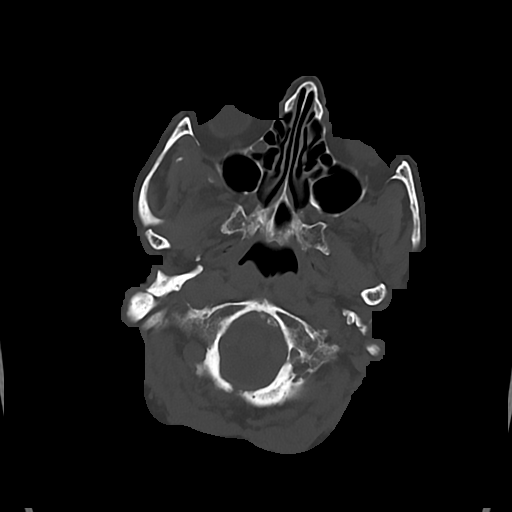
[im 16/76  bone]
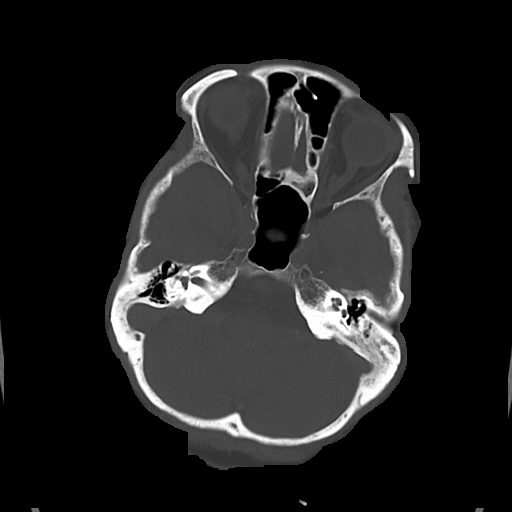
[im 23/76  bone]
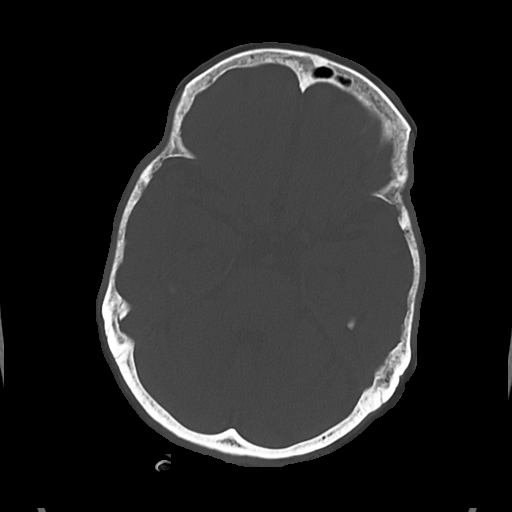
[im 34/76  bone]
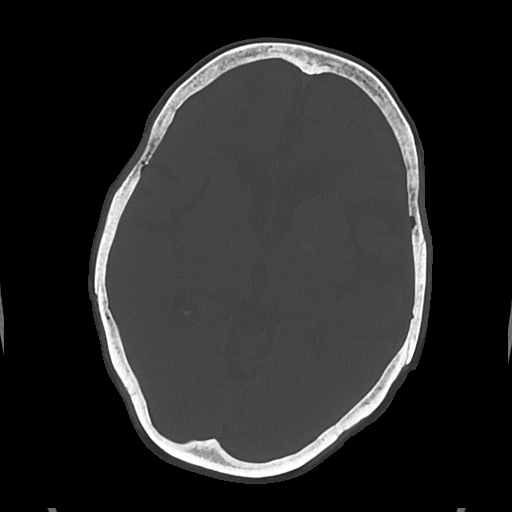

[Series 5: cor soft · coronal · 0.34mm/px · 3 of 69 slices shown]
[im 23/69  brain]
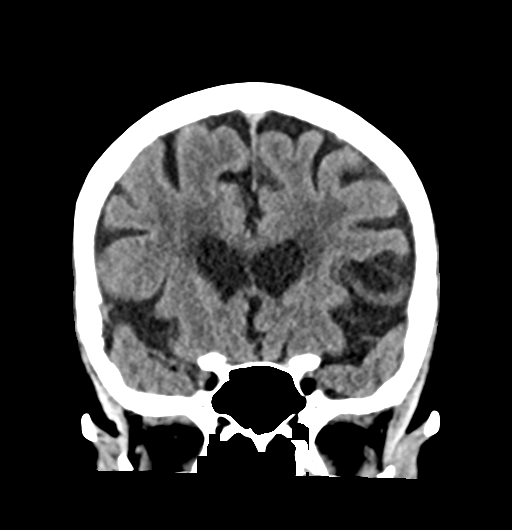
[im 31/69  brain]
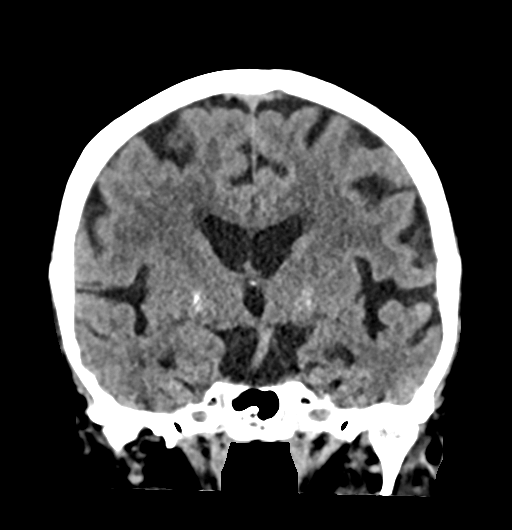
[im 38/69  brain]
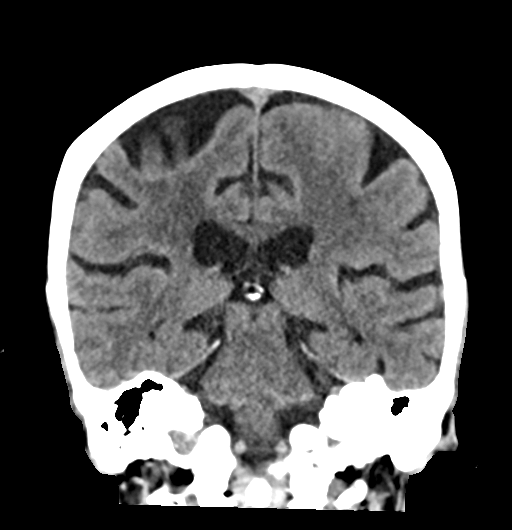

[Series 6: sag soft · sagittal · 0.31mm/px · 3 of 58 slices shown]
[im 20/58  brain]
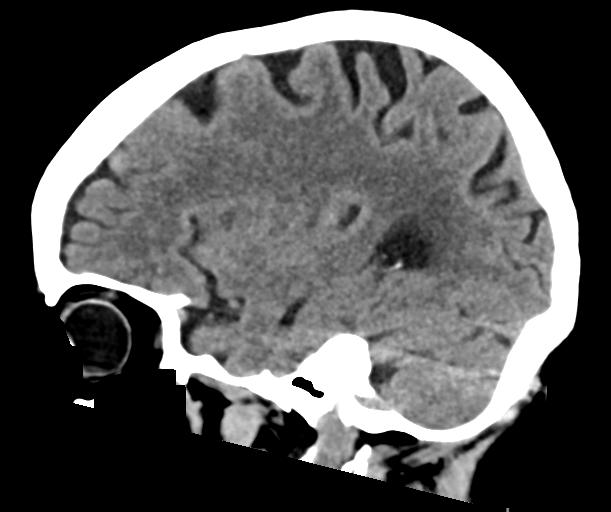
[im 29/58  brain]
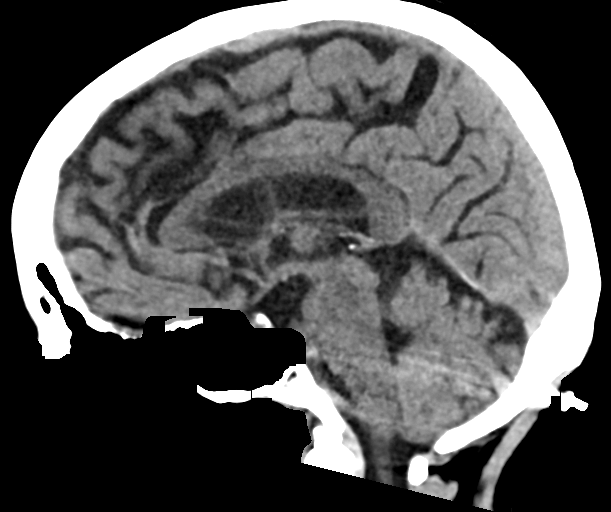
[im 39/58  brain]
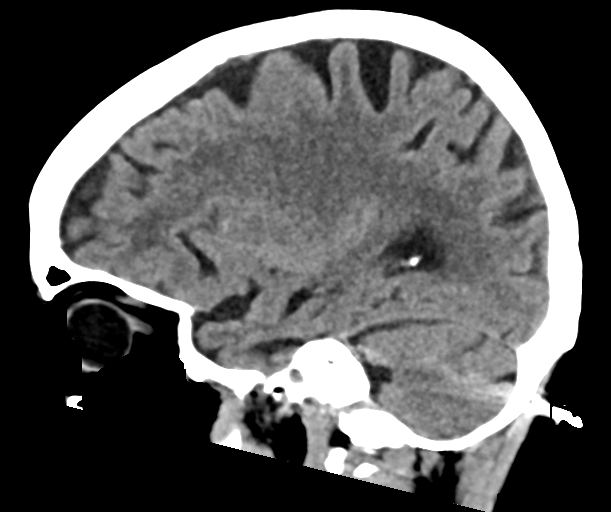

[17 of 47 positions shown; findings below may reference images not displayed]

FINDINGS: Brain: There is mild cerebral atrophy with widening of the
extra-axial spaces and ventricular dilatation.
There are areas of decreased attenuation within the white matter
tracts of the supratentorial brain, consistent with microvascular
disease changes.

Bilateral basal ganglia calcification is noted.

Vascular: No hyperdense vessel or unexpected calcification.

Skull: Normal. Negative for fracture or focal lesion.

Sinuses/Orbits: Mild posterior left ethmoid sinus mucosal thickening
is seen.

Other: None.
IMPRESSION: 1. Generalized cerebral atrophy.
2. No acute intracranial abnormality.
